# Patient Record
Sex: Male | Born: 1945 | Race: Black or African American | Hispanic: No | Marital: Single | Smoking: Former smoker
Health system: Southern US, Community
[De-identification: ages and names within clinical notes are randomized; demographics above are authoritative.]

## PROBLEM LIST (undated history)

## (undated) DIAGNOSIS — I10 Essential (primary) hypertension: Secondary | ICD-10-CM

## (undated) DIAGNOSIS — E559 Vitamin D deficiency, unspecified: Secondary | ICD-10-CM

## (undated) DIAGNOSIS — E785 Hyperlipidemia, unspecified: Secondary | ICD-10-CM

## (undated) HISTORY — PX: OTHER SURGICAL HISTORY: SHX169

## (undated) HISTORY — DX: Essential (primary) hypertension: I10

## (undated) HISTORY — DX: Hyperlipidemia, unspecified: E78.5

## (undated) HISTORY — DX: Vitamin D deficiency, unspecified: E55.9

---

## 2014-08-21 ENCOUNTER — Encounter: Payer: Self-pay | Attending: "Endocrinology | Admitting: Nutrition

## 2014-08-21 VITALS — Ht 70.0 in | Wt 180.0 lb

## 2014-08-21 DIAGNOSIS — E119 Type 2 diabetes mellitus without complications: Secondary | ICD-10-CM

## 2014-08-21 NOTE — Patient Instructions (Signed)
Plan:  Aim for 3-4 Carb Choices per meal (45-60 grams) +/- 1 either way  Aim for 0-1 Carbs per snack if hungry  Include protein in moderation with your meals and snacks Consider reading food labels for Total Carbohydrate and Fat Grams of foods Consider  increasing your activity level 15-30 minutes daily as tolerated Consider checking BG at alternate times per day  Continue taking medication as directed by MD

## 2014-08-21 NOTE — Progress Notes (Signed)
  Medical Nutrition Therapy:  Appt start time: 1000 end time:  1100.   Assessment:  Primary concerns today: Diabetes Type 2. A1C has improved down to 10.1 now from 16.5% 1 year ago. He is complaining of having balancing issues. Has met with a dietitian before but has been a long time ago. Tests his blood sugars in the am. BS usually 150-180's. Hasn't worked in 6-8 months. He is motivated to improve his blood sugars. He has poor carious teeth and can't eat tough foods. Admits to eating a lot of bananas recently. Usually eats 2 meals per day. Starting to walk for exercise.  Preferred Learning Style:  Auditory  Visual  Learning Readiness:   Ready  MEDICATIONS: See list below   DIETARY INTAKE:  24-hr recall:  B ( AM): 2 pkts of flavored oatmea, 8 oz juice  Snk ( AM): banana L ( PM): sandwich, water Snk ( PM):none D ( PM): meat, vegetables, water  Snk ( PM): banana-sometimes eats 3-4 a day Beverages: water, juice  Usual physical activity: starting to walk. Hasn't worked in 6-8 months  Estimated energy needs: 2000 calories  225 g carbohydrates 125 g protein 68 g fat  Progress Towards Goal(s):  In progress.   Nutritional Diagnosis:  NB-1.1 Food and nutrition-related knowledge deficit As related to uncontrolled diabetes.  As evidenced by A1C of 101%.    Intervention:  Nutrition and diabetes education provided using My Plate and Carb Counting Meal Card.  Plan:  Aim for 3-4 Carb Choices per meal (45-60 grams) +/- 1 either way  Aim for 0-1 Carbs per snack if hungry  Include protein in moderation with your meals and snacks Consider reading food labels for Total Carbohydrate and Fat Grams of foods Consider  increasing your activity level 15-30 minutes daily as tolerated Consider checking BG at alternate times per day  Continue taking medication as directed by MD   1. Eat three meals per day.  2  Test blood sugar 2 hours after breakfast once to see if BS is greater 180 mg/dl.  If greater than 180 mg/dl, then cut back to 1 packet of oatmeal and 1/2 banana.  3. Avoid drinking juice unless having a low blood sugar.  4. Walk 30 minutes dialy.  Handouts given during class include:  Meal Plan Card  Carbohydrate guide  Follow-Up Plan: Plan:  Aim for 3-4 Carb Choices per meal (45-60 grams) +/- 1 either way  Aim for 0-1 Carbs per snack if hungry  Include protein in moderation with your meals and snacks Consider reading food labels for Total Carbohydrate and Fat Grams of foods Consider activity for 30 minutes daily as tolerated Consider checking BG at alternate times per day   Teaching Method Utilized: Visual Auditory Hands on  Handouts given during visit include:  Meal Card  The Plate Method  Current HbA1c: 10.1  Barriers to learning/adherence to lifestyle change: none  Demonstrated degree of understanding via:  Teach Back   Monitoring/Evaluation:  Dietary intake, exercise, carb counting, The Plate Method, and body weight in 1 month(s).

## 2015-09-02 ENCOUNTER — Other Ambulatory Visit: Payer: Self-pay | Admitting: "Endocrinology

## 2015-09-03 ENCOUNTER — Other Ambulatory Visit: Payer: Self-pay | Admitting: "Endocrinology

## 2015-09-09 ENCOUNTER — Other Ambulatory Visit: Payer: Self-pay | Admitting: "Endocrinology

## 2015-09-11 LAB — HEMOGLOBIN A1C: HEMOGLOBIN A1C: 13.4 % — AB (ref 4.0–6.0)

## 2015-09-14 ENCOUNTER — Ambulatory Visit: Payer: Self-pay | Admitting: "Endocrinology

## 2015-10-09 ENCOUNTER — Encounter: Payer: Self-pay | Admitting: "Endocrinology

## 2015-10-09 ENCOUNTER — Ambulatory Visit (INDEPENDENT_AMBULATORY_CARE_PROVIDER_SITE_OTHER): Payer: Medicare Other | Admitting: "Endocrinology

## 2015-10-09 ENCOUNTER — Ambulatory Visit: Payer: Self-pay | Admitting: "Endocrinology

## 2015-10-09 VITALS — BP 144/80 | HR 86 | Ht 69.0 in | Wt 176.0 lb

## 2015-10-09 DIAGNOSIS — Z794 Long term (current) use of insulin: Secondary | ICD-10-CM

## 2015-10-09 DIAGNOSIS — E118 Type 2 diabetes mellitus with unspecified complications: Secondary | ICD-10-CM

## 2015-10-09 DIAGNOSIS — E785 Hyperlipidemia, unspecified: Secondary | ICD-10-CM

## 2015-10-09 DIAGNOSIS — I10 Essential (primary) hypertension: Secondary | ICD-10-CM | POA: Diagnosis not present

## 2015-10-09 DIAGNOSIS — Z91199 Patient's noncompliance with other medical treatment and regimen due to unspecified reason: Secondary | ICD-10-CM

## 2015-10-09 DIAGNOSIS — Z9119 Patient's noncompliance with other medical treatment and regimen: Secondary | ICD-10-CM | POA: Diagnosis not present

## 2015-10-09 DIAGNOSIS — E1165 Type 2 diabetes mellitus with hyperglycemia: Secondary | ICD-10-CM | POA: Insufficient documentation

## 2015-10-09 DIAGNOSIS — IMO0002 Reserved for concepts with insufficient information to code with codable children: Secondary | ICD-10-CM | POA: Insufficient documentation

## 2015-10-09 DIAGNOSIS — E559 Vitamin D deficiency, unspecified: Secondary | ICD-10-CM

## 2015-10-09 NOTE — Progress Notes (Signed)
Subjective:    Patient ID: Charles Glass, male    DOB: 1946/05/24,    Past Medical History  Diagnosis Date  . Hypertension   . Hyperlipidemia   . Vitamin D deficiency    Past Surgical History  Procedure Laterality Date  . C-spine    . Other surgical history  Left Arm   Social History   Social History  . Marital Status: Single    Spouse Name: N/A  . Number of Children: N/A  . Years of Education: N/A   Social History Main Topics  . Smoking status: Former Games developer  . Smokeless tobacco: None  . Alcohol Use: No  . Drug Use: No  . Sexual Activity: Not Asked   Other Topics Concern  . None   Social History Narrative  . None   Outpatient Encounter Prescriptions as of 10/09/2015  Medication Sig  . amitriptyline (ELAVIL) 25 MG tablet Take 25 mg by mouth at bedtime.  Marland Kitchen aspirin 325 MG tablet Take 325 mg by mouth daily.  . insulin detemir (LEVEMIR) 100 UNIT/ML injection Inject 40 Units into the skin at bedtime.  . metFORMIN (GLUCOPHAGE) 1000 MG tablet Take 1,000 mg by mouth 2 (two) times daily with a meal.  . multivitamin-iron-minerals-folic acid (CENTRUM) chewable tablet Chew 1 tablet by mouth daily.  . pravastatin (PRAVACHOL) 40 MG tablet Take 40 mg by mouth daily.  . [DISCONTINUED] dapagliflozin propanediol (FARXIGA) 10 MG TABS tablet Take 10 mg by mouth daily.   No facility-administered encounter medications on file as of 10/09/2015.   ALLERGIES: No Known Allergies VACCINATION STATUS:  There is no immunization history on file for this patient.  Diabetes He presents for his follow-up diabetic visit. He has type 2 diabetes mellitus. Onset time: He was diagnosed at approximate age of 46 years. His disease course has been worsening. There are no hypoglycemic associated symptoms. Pertinent negatives for hypoglycemia include no confusion, headaches, pallor or seizures. Associated symptoms include polydipsia and polyuria. Pertinent negatives for diabetes include no chest pain, no  fatigue, no polyphagia and no weakness. There are no hypoglycemic complications. Symptoms are worsening. There are no diabetic complications. (He has dental problems. He starts complaining of blurry vision.) Risk factors for coronary artery disease include diabetes mellitus, dyslipidemia, male sex, sedentary lifestyle and tobacco exposure. He is compliant with treatment some of the time. His weight is stable. He is following a generally unhealthy diet. He has not had a previous visit with a dietitian. He participates in exercise intermittently. Home blood sugar record trend: He came with no meter nor logs to review.  Hyperlipidemia This is a chronic problem. The current episode started more than 1 year ago. Pertinent negatives include no chest pain, myalgias or shortness of breath. Risk factors for coronary artery disease include diabetes mellitus, dyslipidemia, hypertension, male sex and a sedentary lifestyle.  Hypertension This is a chronic problem. The current episode started more than 1 year ago. Pertinent negatives include no chest pain, headaches, neck pain, palpitations or shortness of breath. Risk factors for coronary artery disease include diabetes mellitus, dyslipidemia and sedentary lifestyle.     Review of Systems  Constitutional: Negative for fatigue and unexpected weight change.  HENT: Negative for dental problem, mouth sores and trouble swallowing.   Eyes: Negative for visual disturbance.  Respiratory: Negative for cough, choking, chest tightness, shortness of breath and wheezing.   Cardiovascular: Negative for chest pain, palpitations and leg swelling.  Gastrointestinal: Negative for nausea, vomiting, abdominal pain, diarrhea, constipation  and abdominal distention.  Endocrine: Positive for polydipsia and polyuria. Negative for polyphagia.  Genitourinary: Negative for dysuria, urgency, hematuria and flank pain.  Musculoskeletal: Negative for myalgias, back pain, gait problem and neck  pain.  Skin: Negative for pallor, rash and wound.  Neurological: Negative for seizures, syncope, weakness, numbness and headaches.  Psychiatric/Behavioral: Negative.  Negative for confusion and dysphoric mood.    Objective:    BP 144/80 mmHg  Pulse 86  Ht 5\' 9"  (1.753 m)  Wt 176 lb (79.833 kg)  BMI 25.98 kg/m2  SpO2 96%  Wt Readings from Last 3 Encounters:  10/09/15 176 lb (79.833 kg)  08/21/14 180 lb (81.647 kg)    Physical Exam  Constitutional: He is oriented to person, place, and time. He appears well-developed and well-nourished. He is cooperative. No distress.  HENT:  Head: Normocephalic and atraumatic.  Eyes: EOM are normal.  Neck: Normal range of motion. Neck supple. No tracheal deviation present. No thyromegaly present.  Cardiovascular: Normal rate, S1 normal, S2 normal and normal heart sounds.  Exam reveals no gallop.   No murmur heard. Pulses:      Dorsalis pedis pulses are 1+ on the right side, and 1+ on the left side.       Posterior tibial pulses are 1+ on the right side, and 1+ on the left side.  Pulmonary/Chest: Breath sounds normal. No respiratory distress. He has no wheezes.  Abdominal: Soft. Bowel sounds are normal. He exhibits no distension. There is no tenderness. There is no guarding and no CVA tenderness.  Musculoskeletal: He exhibits no edema.       Right shoulder: He exhibits no swelling and no deformity.  Neurological: He is alert and oriented to person, place, and time. He has normal strength and normal reflexes. No cranial nerve deficit or sensory deficit. Gait normal.  Skin: Skin is warm and dry. No rash noted. No cyanosis. Nails show no clubbing.  Psychiatric: He has a normal mood and affect. His speech is normal and behavior is normal. Judgment and thought content normal. Cognition and memory are normal.          Assessment & Plan:   1. Uncontrolled type 2 diabetes mellitus with complication, with long-term current use of insulin Charles Glass(HCC)  -Mr.  Charles Kettleeal remains seriously noncompliant. He  remains at a high risk for more acute and chronic complications of diabetes which include CAD, CVA, CKD, retinopathy, and neuropathy. These are all discussed in detail with the patient.  Patient came with out any glucose profile, and  recent A1c of 13.4 %.  Recent labs reviewed.   - I have re-counseled the patient on diet management   by adopting a carbohydrate restricted / protein rich  Diet.  - Suggestion is made for patient to avoid simple carbohydrates   from their diet including Cakes , Desserts, Ice Cream,  Soda (  diet and regular) , Sweet Tea , Candies,  Chips, Cookies, Artificial Sweeteners,   and "Sugar-free" Products .  This will help patient to have stable blood glucose profile and potentially avoid unintended  Weight gain.  - Patient is advised to stick to a routine mealtimes to eat 3 meals  a day and avoid unnecessary snacks ( to snack only to correct hypoglycemia).  - The patient  will be  scheduled with Norm SaltPenny Crumpton, RDN, CDE for individualized DM education.  - I have approached patient with the following individualized plan to manage diabetes and patient agrees.  - I will increase  his basal insulin Levemir to 40 units QHS, associated with strict monitoring of glucose  AC and HS. -He will need bolus insulin if he commits properly for monitoring blood glucose. -If he brings his logs I would consider for prandial insulin in 2 weeks time. - Patient is warned not to take insulin without proper monitoring per orders.  -Patient is encouraged to call clinic for blood glucose levels less than 70 or above 300 mg /dl. - I will continue metformin 1000 mg by mouth twice a day, therapeutically suitable for patient. -He did not afford [place].   - Patient specific target  for A1c; LDL, HDL, Triglycerides, and  Waist Circumference were discussed in detail.  2) BP/HTN: Controlled. Continue current medications . 3) Lipids/HPL:  continue  statins. 4)  Weight/Diet: CDE consult in progress, exercise, and carbohydrates information provided.  5) Vitamin D deficiency -New diagnosis. I advised him to continue vitamin D supplement 50,000 units weekly for the next 12 weeks.  6) Personal history of noncompliance with medical treatment, presenting hazards to health -Patient has a habit of not showing up for appointments and loses interest very easily in self-care. I have spent 20 minutes counseling him to come eat better to treat diabetes to avoid complications.  7) Chronic Care/Health Maintenance:  -Patient  Is on  Statin medications and encouraged to continue to follow up with Ophthalmology, Podiatrist at least yearly or according to recommendations, and advised to  stay away from smoking. I have recommended yearly flu vaccine and pneumonia vaccination at least every 5 years; moderate intensity exercise for up to 150 minutes weekly; and  sleep for at least 7 hours a day.  I advised patient to maintain close follow up with their PCP for primary care needs.  Patient is asked to bring meter and  blood glucose logs during their next visit.   Follow up plan: Return in about 2 weeks (around 10/23/2015) for diabetes, high blood pressure, high cholesterol.  Marquis Lunch, MD Phone: [phone]  Fax: [phone]   10/09/2015, 11:51 AM

## 2015-10-09 NOTE — Patient Instructions (Signed)

## 2015-10-23 ENCOUNTER — Telehealth: Payer: Self-pay | Admitting: *Deleted

## 2015-10-23 ENCOUNTER — Encounter: Payer: Medicare Other | Admitting: "Endocrinology

## 2015-10-23 ENCOUNTER — Ambulatory Visit: Payer: Medicare Other | Admitting: "Endocrinology

## 2015-10-23 ENCOUNTER — Encounter: Payer: Self-pay | Admitting: "Endocrinology

## 2015-10-23 NOTE — Telephone Encounter (Signed)
We will wait to see his meter and logs before we refill and he has to keep his appointment. He may get a sample of insulin he is taking-1 each.

## 2015-10-23 NOTE — Telephone Encounter (Signed)
Patient came in for his 2 week follow up appointment, however, he forgot his logs and meter, therefore he was asked to reschedule his appointment. He is requesting a  refill on his Levemir (pen) [facility].

## 2015-10-26 NOTE — Progress Notes (Signed)
This patient no showed on 10/23/2015. He was not seen.

## 2015-10-27 NOTE — Telephone Encounter (Signed)
Routing to Mohawk IndustriesKim French, LPN

## 2015-11-02 ENCOUNTER — Other Ambulatory Visit: Payer: Self-pay | Admitting: "Endocrinology

## 2015-11-03 NOTE — Telephone Encounter (Signed)
Called pt and left messages. No reply.

## 2015-11-04 ENCOUNTER — Other Ambulatory Visit: Payer: Self-pay | Admitting: "Endocrinology

## 2015-11-04 MED ORDER — INSULIN PEN NEEDLE 31G X 8 MM MISC: 1.0000 | Status: DC

## 2015-11-05 NOTE — Progress Notes (Signed)
This encounter was created in error - please disregard.

## 2015-11-11 ENCOUNTER — Ambulatory Visit: Payer: Medicare Other | Admitting: "Endocrinology

## 2015-11-16 ENCOUNTER — Ambulatory Visit: Payer: Self-pay | Admitting: Nutrition

## 2015-11-16 NOTE — Progress Notes (Signed)
Chart opened in error. PC

## 2015-11-26 ENCOUNTER — Ambulatory Visit: Payer: Medicare Other | Admitting: "Endocrinology

## 2015-12-01 ENCOUNTER — Emergency Department (HOSPITAL_COMMUNITY): Payer: Medicare Other

## 2015-12-01 ENCOUNTER — Emergency Department (HOSPITAL_COMMUNITY)
Admission: EM | Admit: 2015-12-01 | Discharge: 2015-12-01 | Disposition: A | Discharge status: Home / Self Care | Payer: Medicare Other | Attending: Emergency Medicine | Admitting: Emergency Medicine

## 2015-12-01 ENCOUNTER — Encounter (HOSPITAL_COMMUNITY): Payer: Self-pay | Admitting: *Deleted

## 2015-12-01 DIAGNOSIS — S4992XA Unspecified injury of left shoulder and upper arm, initial encounter: Secondary | ICD-10-CM | POA: Insufficient documentation

## 2015-12-01 DIAGNOSIS — Z7984 Long term (current) use of oral hypoglycemic drugs: Secondary | ICD-10-CM | POA: Insufficient documentation

## 2015-12-01 DIAGNOSIS — I10 Essential (primary) hypertension: Secondary | ICD-10-CM | POA: Insufficient documentation

## 2015-12-01 DIAGNOSIS — Y998 Other external cause status: Secondary | ICD-10-CM | POA: Diagnosis not present

## 2015-12-01 DIAGNOSIS — E785 Hyperlipidemia, unspecified: Secondary | ICD-10-CM | POA: Insufficient documentation

## 2015-12-01 DIAGNOSIS — S20219A Contusion of unspecified front wall of thorax, initial encounter: Secondary | ICD-10-CM | POA: Diagnosis not present

## 2015-12-01 DIAGNOSIS — Z79899 Other long term (current) drug therapy: Secondary | ICD-10-CM | POA: Insufficient documentation

## 2015-12-01 DIAGNOSIS — S199XXA Unspecified injury of neck, initial encounter: Secondary | ICD-10-CM | POA: Insufficient documentation

## 2015-12-01 DIAGNOSIS — S20212A Contusion of left front wall of thorax, initial encounter: Secondary | ICD-10-CM

## 2015-12-01 DIAGNOSIS — Z7982 Long term (current) use of aspirin: Secondary | ICD-10-CM | POA: Diagnosis not present

## 2015-12-01 DIAGNOSIS — Y92194 Driveway of other specified residential institution as the place of occurrence of the external cause: Secondary | ICD-10-CM | POA: Diagnosis not present

## 2015-12-01 DIAGNOSIS — Z792 Long term (current) use of antibiotics: Secondary | ICD-10-CM | POA: Insufficient documentation

## 2015-12-01 DIAGNOSIS — S43102A Unspecified dislocation of left acromioclavicular joint, initial encounter: Secondary | ICD-10-CM

## 2015-12-01 DIAGNOSIS — S299XXA Unspecified injury of thorax, initial encounter: Secondary | ICD-10-CM | POA: Diagnosis present

## 2015-12-01 DIAGNOSIS — Y9389 Activity, other specified: Secondary | ICD-10-CM | POA: Insufficient documentation

## 2015-12-01 MED ORDER — HYDROCODONE-ACETAMINOPHEN 5-325 MG PO TABS: 1.0000 | ORAL_TABLET | Freq: Four times a day (QID) | ORAL | Status: DC | PRN

## 2015-12-01 MED ORDER — DOCUSATE SODIUM 100 MG PO CAPS: 100.0000 mg | ORAL_CAPSULE | Freq: Two times a day (BID) | ORAL | Status: DC

## 2015-12-01 MED ORDER — ACETAMINOPHEN 500 MG PO TABS
1000.0000 mg | ORAL_TABLET | Freq: Once | ORAL | Status: AC
  Administered 2015-12-01: 1000 mg via ORAL
  Filled 2015-12-01: qty 2
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
## 2015-12-01 NOTE — Discharge Instructions (Signed)
Acromioclavicular Separation With Rehab The acromioclavicular joint is the joint between the roof of the shoulder (acromion) and the collarbone (clavicle). It is vulnerable to injury. An acromioclavicular [facility]) separation is a partial or complete tear (sprain), injury, or redness and soreness (inflammation) of the ligaments that cross the acromioclavicular joint and hold it in place. There are two ligaments in this area that are vulnerable to injury, the acromioclavicular ligament and the coracoclavicular ligament. SYMPTOMS   Tenderness and swelling, or a bump on top of the shoulder (at the [facility] joint).  Bruising (contusion) in the area within 48 hours of injury.  Loss of strength or pain when reaching over the head or across the body. CAUSES  AC separation is caused by direct trauma to the joint (falling on your shoulder) or indirect trauma (falling on an outstretched arm). RISK INCREASES WITH:  Sports that require contact or collision, throwing sports (i.e. racquetball, squash).  Poor strength and flexibility.  Previous shoulder sprain or dislocation.  Poorly fitted or padded protective equipment. PREVENTION   Warm-up and stretch properly before activity.  Maintain physical fitness:  Shoulder strength.  Shoulder flexibility.  Cardiovascular fitness.  Wear properly fitted and padded protective equipment.  Learn and use proper technique when playing sports. Have a coach correct improper technique, including falling and landing.  Apply taping, protective strapping or padding, or an adhesive bandage as recommended before practice or competition. PROGNOSIS   If treated properly, the symptoms of AC separation can be expected to go away.  If treated improperly, permanent disability may occur unless surgery is performed.  Healing time varies with type of sport and position, arm injured (dominant versus non-dominant) and severity of sprain. RELATED COMPLICATIONS  Weakness and  fatigue of the arm or shoulder are possible but uncommon.  Pain and inflammation of the [facility] joint may continue.  Prolonged healing time may be necessary if usual activities are resumed too early. This causes a susceptibility to recurrent injury.  Prolonged disability may occur.  The shoulder may remain unstable or arthritic following repeated injury. TREATMENT  Treatment initially involves ice and medication to help reduce pain and inflammation. It may also be necessary to modify your activities in order to prevent further injury. Both non-surgical and surgical interventions exist to treat AC separation. Non-surgical intervention is usually recommended and involves wearing a sling to immobilize the joint for a period of time to allow for healing. Surgical intervention is usually only considered for severe sprains of the ligament or for individuals who do not improve after 2 to 6 months of non-surgical treatment. Surgical interventions require 4 to 6 months before a return to sports is possible. MEDICATION  If pain medication is necessary, nonsteroidal anti-inflammatory medications, such as aspirin and ibuprofen, or other minor pain relievers, such as acetaminophen, are often recommended.  Do not take pain medication for 7 days before surgery.  Prescription pain relievers may be given by your caregiver. Use only as directed and only as much as you need.  Ointments applied to the skin may be helpful.  Corticosteroid injections may be given to reduce inflammation. HEAT AND COLD  Cold treatment (icing) relieves pain and reduces inflammation. Cold treatment should be applied for 10 to 15 minutes every 2 to 3 hours for inflammation and pain and immediately after any activity that aggravates your symptoms. Use ice packs or an ice massage.  Heat treatment may be used prior to performing the stretching and strengthening activities prescribed by your caregiver, physical therapist or  Pensions consultantathletic  trainer. Use a heat pack or a warm soak. SEEK IMMEDIATE MEDICAL CARE IF:   Pain, swelling or bruising worsens despite treatment.  There is pain, numbness or coldness in the arm.  Discoloration appears in the fingernails.  New, unexplained symptoms develop.    Chest Contusion A chest contusion is a deep bruise on your chest area. Contusions are the result of an injury that caused bleeding under the skin. A chest contusion may involve bruising of the skin, muscles, or ribs. The contusion may turn blue, purple, or yellow. Minor injuries will give you a painless contusion, but more severe contusions may stay painful and swollen for a few weeks. CAUSES  A contusion is usually caused by a blow, trauma, or direct force to an area of the body. SYMPTOMS   Swelling and redness of the injured area.  Discoloration of the injured area.  Tenderness and soreness of the injured area.  Pain. DIAGNOSIS  The diagnosis can be made by taking a history and performing a physical exam. An X-ray, CT scan, or MRI may be needed to determine if there were any associated injuries, such as broken bones (fractures) or internal injuries. TREATMENT  Often, the best treatment for a chest contusion is resting, icing, and applying cold compresses to the injured area. Deep breathing exercises may be recommended to reduce the risk of pneumonia. Over-the-counter medicines may also be recommended for pain control. HOME CARE INSTRUCTIONS   Put ice on the injured area.  Put ice in a plastic bag.  Place a towel between your skin and the bag.  Leave the ice on for 15-20 minutes, 03-04 times a day.  Only take over-the-counter or prescription medicines as directed by your caregiver. Your caregiver may recommend avoiding anti-inflammatory medicines (aspirin, ibuprofen, and naproxen) for 48 hours because these medicines may increase bruising.  Rest the injured area.  Perform deep-breathing exercises as directed by your  caregiver.  Stop smoking if you smoke.  Do not lift objects over 5 pounds (2.3 kg) for 3 days or longer if recommended by your caregiver. SEEK IMMEDIATE MEDICAL CARE IF:   You have increased bruising or swelling.  You have pain that is getting worse.  You have difficulty breathing.  You have dizziness, weakness, or fainting.  You have blood in your urine or stool.  You cough up or vomit blood.  Your swelling or pain is not relieved with medicines. MAKE SURE YOU:   Understand these instructions.  Will watch your condition.  Will get help right away if you are not doing well or get worse.   This information is not intended to replace advice given to you by your health care provider. Make sure you discuss any questions you have with your health care provider.   Document Released: 08/16/2001 Document Revised: 08/15/2012 Document Reviewed: 05/14/2012 Elsevier Interactive Patient Education [employer]2016 Elsevier Inc.

## 2015-12-01 NOTE — ED Notes (Signed)
Pt made aware to return if symptoms worsen or if any life threatening symptoms occur.   

## 2015-12-21 ENCOUNTER — Ambulatory Visit: Payer: Medicare Other | Admitting: "Endocrinology

## 2015-12-25 ENCOUNTER — Telehealth: Payer: Self-pay | Admitting: Nutrition

## 2016-01-05 ENCOUNTER — Ambulatory Visit: Payer: Medicare Other | Admitting: "Endocrinology

## 2016-01-08 NOTE — Telephone Encounter (Signed)
VM to call for appt. 

## 2016-01-11 ENCOUNTER — Encounter: Payer: Self-pay | Admitting: "Endocrinology

## 2016-01-24 ENCOUNTER — Other Ambulatory Visit: Payer: Self-pay | Admitting: "Endocrinology

## 2016-01-25 ENCOUNTER — Ambulatory Visit: Payer: Medicare Other | Admitting: Endocrinology

## 2016-02-01 ENCOUNTER — Ambulatory Visit: Payer: Medicare Other | Admitting: Endocrinology

## 2016-02-01 ENCOUNTER — Ambulatory Visit: Payer: Medicare Other | Admitting: Nutrition

## 2016-02-17 ENCOUNTER — Ambulatory Visit (INDEPENDENT_AMBULATORY_CARE_PROVIDER_SITE_OTHER): Payer: Medicare Other | Admitting: Endocrinology

## 2016-02-17 ENCOUNTER — Encounter: Payer: Self-pay | Admitting: Endocrinology

## 2016-02-17 VITALS — BP 132/80 | HR 86 | Temp 97.5°F | Ht 70.0 in | Wt 176.6 lb

## 2016-02-17 DIAGNOSIS — Z794 Long term (current) use of insulin: Secondary | ICD-10-CM

## 2016-02-17 DIAGNOSIS — E1165 Type 2 diabetes mellitus with hyperglycemia: Secondary | ICD-10-CM

## 2016-02-17 DIAGNOSIS — E118 Type 2 diabetes mellitus with unspecified complications: Secondary | ICD-10-CM | POA: Diagnosis not present

## 2016-02-17 DIAGNOSIS — IMO0002 Reserved for concepts with insufficient information to code with codable children: Secondary | ICD-10-CM

## 2016-02-17 LAB — POCT GLYCOSYLATED HEMOGLOBIN (HGB A1C): Hemoglobin A1C: 13.7

## 2016-02-17 MED ORDER — INSULIN DETEMIR 100 UNIT/ML FLEXPEN: 50.0000 [IU] | PEN_INJECTOR | SUBCUTANEOUS | Status: DC

## 2016-02-17 NOTE — Progress Notes (Signed)
Pre visit review using our clinic review tool, if applicable. No additional management support is needed unless otherwise documented below in the visit note. 

## 2016-02-17 NOTE — Patient Instructions (Addendum)
good diet and exercise significantly improve the control of your diabetes.  please let me know if you wish to be referred to a dietician.  high blood sugar is very risky to your health.  you should see an eye doctor and dentist every year.  It is very important to get all recommended vaccinations.  controlling your blood pressure and cholesterol drastically reduces the damage diabetes does to your body.  Those who smoke should quit.  please discuss these with your doctor.   check your blood sugar twice a day.  vary the time of day when you check, between before the 3 meals, and at bedtime.  also check if you have symptoms of your blood sugar being too high or too low.  please keep a record of the readings and bring it to your next appointment here (or you can bring the meter itself).  You can write it on any piece of paper.  please call us sooner if your blood sugar goes below 70, or if you have a lot of readings over 200.  Please stop taking the metformin, and:  Increase the levemir to 50 units daily, and take it in the morning.  I have sent a prescription to your pharmacy.   Please call us next week, to tell us how the blood sugar is doing.  Please come back for a follow-up appointment in 1 month.

## 2016-02-17 NOTE — Progress Notes (Signed)
Subjective:    Patient ID: Charles Glass, male    DOB: 07/29/1946, 70 y.o.   MRN: [id number]  HPI pt states DM was dx'ed in 2000; he has mild neuropathy of the lower extremities, but he is unaware of any associated chronic complications; he has been on insulin since 2015; pt says his diet and exercise are not good; he has never had pancreatitis, severe hypoglycemia or DKA.  He says he now never misses the insulin.  He takes levemir and metformin.  He denies hypoglycemia Past Medical History  Diagnosis Date  . Hypertension   . Hyperlipidemia   . Vitamin D deficiency     Past Surgical History  Procedure Laterality Date  . C-spine    . Other surgical history  Left Arm    Social History   Social History  . Marital Status: Single    Spouse Name: N/A  . Number of Children: N/A  . Years of Education: N/A   Occupational History  . Not on file.   Social History Main Topics  . Smoking status: Former Games developermoker  . Smokeless tobacco: Not on file  . Alcohol Use: No  . Drug Use: No  . Sexual Activity: Not on file   Other Topics Concern  . Not on file   Social History Narrative    Current Outpatient Prescriptions on File Prior to Visit  Medication Sig Dispense Refill  . amitriptyline (ELAVIL) 25 MG tablet Take 25 mg by mouth at bedtime.    Marland Kitchen. aspirin 325 MG tablet Take 325 mg by mouth daily.    . multivitamin-iron-minerals-folic acid (CENTRUM) chewable tablet Chew 1 tablet by mouth daily.    . pravastatin (PRAVACHOL) 40 MG tablet Take 40 mg by mouth daily.    Marland Kitchen. UNIFINE PENTIPS 31G X 6 MM MISC USE ONE PENTIP ONCE DAILY AS  DIRECTED 30 each 0   No current facility-administered medications on file prior to visit.    No Known Allergies  Family History  Problem Relation Age of Onset  . Diabetes Mother     BP 132/80 mmHg  Pulse 86  Temp(Src) 97.5 F (36.4 C) (Oral)  Ht 5\' 10"  (1.778 m)  Wt 176 lb 9.6 oz (80.105 kg)  BMI 25.34 kg/m2  SpO2 98%    Review of Systems denies  weight loss, blurry vision, headache, chest pain, sob, n/v, urinary frequency, muscle cramps, excessive diaphoresis, depression, cold intolerance, rhinorrhea, and easy bruising.     Objective:   Physical Exam VS: see vs page GEN: no distress HEAD: head: no deformity eyes: no periorbital swelling, no proptosis external nose and ears are normal mouth: no lesion seen NECK: supple, thyroid is not enlarged.   CHEST WALL: no deformity LUNGS: clear to auscultation BREASTS:  No gynecomastia CV: reg rate and rhythm, no murmur ABD: abdomen is soft, nontender.  no hepatosplenomegaly.  not distended.  no hernia MUSCULOSKELETAL: muscle bulk and strength are grossly normal.  no obvious joint swelling.  gait is normal and steady EXTEMITIES: no deformity.  no ulcer on the feet.  feet are of normal color and temp.  no edema PULSES: dorsalis pedis intact bilat.  no carotid bruit NEURO:  cn 2-12 grossly intact.   readily moves all 4's.  sensation is intact to touch on the feet SKIN:  Normal texture and temperature.  No rash or suspicious lesion is visible.   NODES:  None palpable at the neck.   PSYCH: alert, well-oriented.  Does not appear anxious nor  depressed.    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  Hypertriglyceridemia: prob exac by hyperglycemia.  We'll recheck when glycemic control is better.    Patient is advised the following: Patient Instructions  good diet and exercise significantly improve the control of your diabetes.  please let me know if you wish to be referred to a dietician.  high blood sugar is very risky to your health.  you should see an eye doctor and dentist every year.  It is very important to get all recommended vaccinations.  controlling your blood pressure and  cholesterol drastically reduces the damage diabetes does to your body.  Those who smoke should quit.  please discuss these with your doctor.   check your blood sugar twice a day.  vary the time of day when you check, between before the 3 meals, and at bedtime.  also check if you have symptoms of your blood sugar being too high or too low.  please keep a record of the readings and bring it to your next appointment here (or you can bring the meter itself).  You can write it on any piece of paper.  please call us sooner if your blood sugar goes below 70, or if you have a lot of readings over 200.  Please stop taking the metformin, and:  Increase the levemir to 50 units daily, and take it in the morning.  I have sent a prescription to your pharmacy.   Please call us next week, to tell us how the blood sugar is doing.  Please come back for a follow-up appointment in 1 month.

## 2016-03-03 ENCOUNTER — Telehealth: Payer: Self-pay | Admitting: Endocrinology

## 2016-03-03 MED ORDER — INSULIN DETEMIR 100 UNIT/ML FLEXPEN: 50.0000 [IU] | PEN_INJECTOR | SUBCUTANEOUS | Status: DC

## 2016-03-03 MED ORDER — INSULIN PEN NEEDLE 31G X 6 MM MISC: Status: AC

## 2016-03-03 NOTE — Telephone Encounter (Signed)
Levemir: please refill prn Pravastatin: please ask pt to call pcp for refill.

## 2016-03-03 NOTE — Telephone Encounter (Signed)
PT also needs Pravastatin sent into the pharmacy

## 2016-03-03 NOTE — Telephone Encounter (Signed)
See note below and please advise if we can refill the pravastatin. Thanks!

## 2016-03-03 NOTE — Telephone Encounter (Signed)
Left  vm advising pt we have refill the Levemir, but the pravastatin rx would need to come from the pt's PCP. Requested a call back if the pt would like to discuss.

## 2016-03-03 NOTE — Telephone Encounter (Signed)
Pt needs refills of Levemir and [facility]

## 2016-03-23 ENCOUNTER — Ambulatory Visit (INDEPENDENT_AMBULATORY_CARE_PROVIDER_SITE_OTHER): Payer: Medicare Other | Admitting: Endocrinology

## 2016-03-23 ENCOUNTER — Encounter: Payer: Self-pay | Admitting: Endocrinology

## 2016-03-23 VITALS — BP 124/80 | HR 91 | Ht 70.0 in | Wt 171.0 lb

## 2016-03-23 DIAGNOSIS — E118 Type 2 diabetes mellitus with unspecified complications: Secondary | ICD-10-CM | POA: Diagnosis not present

## 2016-03-23 DIAGNOSIS — E1165 Type 2 diabetes mellitus with hyperglycemia: Secondary | ICD-10-CM | POA: Diagnosis not present

## 2016-03-23 DIAGNOSIS — Z794 Long term (current) use of insulin: Secondary | ICD-10-CM

## 2016-03-23 DIAGNOSIS — IMO0002 Reserved for concepts with insufficient information to code with codable children: Secondary | ICD-10-CM

## 2016-03-23 MED ORDER — INSULIN DETEMIR 100 UNIT/ML FLEXPEN: 60.0000 [IU] | PEN_INJECTOR | SUBCUTANEOUS | Status: DC

## 2016-03-23 NOTE — Patient Instructions (Addendum)
check your blood sugar twice a day.  vary the time of day when you check, between before the 3 meals, and at bedtime.  also check if you have symptoms of your blood sugar being too high or too low.  please keep a record of the readings and bring it to your next appointment here (or you can bring the meter itself).  You can write it on any piece of paper.  please call us sooner if your blood sugar goes below 70, or if you have a lot of readings over 200.  Increase the levemir to 60 units each morning.   Please come back for a follow-up appointment in 1 month.

## 2016-03-23 NOTE — Progress Notes (Signed)
Subjective:    Patient ID: Charles Glass, male    DOB: 10/25/1946, 70 y.o.   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  Interval history: Meter is downloaded today, and the printout is scanned into the record.  cbg varies from 150-280.  All are checked in am.  Pt says he sometimes misses the insulin injections.   Past Medical History  Diagnosis Date  . Hypertension   . Hyperlipidemia   . Vitamin D deficiency     Past Surgical History  Procedure Laterality Date  . C-spine    . Other surgical history  Left Arm    Social History   Social History  . Marital Status: Single    Spouse Name: N/A  . Number of Children: N/A  . Years of Education: N/A   Occupational History  . Not on file.   Social History Main Topics  . Smoking status: Former Games developermoker  . Smokeless tobacco: Not on file  . Alcohol Use: No  . Drug Use: No  . Sexual Activity: Not on file   Other Topics Concern  . Not on file   Social History Narrative    Current Outpatient Prescriptions on File Prior to Visit  Medication Sig Dispense Refill  . amitriptyline (ELAVIL) 25 MG tablet Take 25 mg by mouth at bedtime.    Marland Kitchen. aspirin 325 MG tablet Take 325 mg by mouth daily.    . Insulin Pen Needle (UNIFINE PENTIPS) 31G X 6 MM MISC Use 1 time per day to inject insulin. 60 each 2  . multivitamin-iron-minerals-folic acid (CENTRUM) chewable tablet Chew 1 tablet by mouth daily.    . pravastatin (PRAVACHOL) 40 MG tablet Take 40 mg by mouth daily.     No current facility-administered medications on file prior to visit.    No Known Allergies  Family History  Problem Relation Age of Onset  . Diabetes Mother     BP 124/80  mmHg  Pulse 91  Ht 5\' 10"  (1.778 m)  Wt 171 lb (77.565 kg)  BMI 24.54 kg/m2  SpO2 97%  Review of Systems He denies hypoglycemia.      Objective:   Physical Exam VITAL SIGNS:  See vs page GENERAL: no distress SKIN:  Insulin injection sites at the anterior abdomen are normal.   Pulses: dorsalis pedis intact bilat.   MSK: no deformity of the feet CV: no leg edema Skin:  no ulcer on the feet.  normal color and temp on the feet. Neuro: sensation is intact to touch on the feet.       Assessment & Plan:  DM: he needs increased rx.   noncompliance with cbg recording and insulin.    Patient is advised the following: Patient Instructions  check your blood sugar twice a day.  vary the time of day when you check, between before the 3 meals, and at bedtime.  also check if you have symptoms of your blood sugar being too high or too low.  please keep a record of the readings and bring it to your next appointment here (or you can bring the meter itself).  You can write it on any piece of paper.  please call us sooner if your blood sugar goes  below 70, or if you have a lot of readings over 200.  Increase the levemir to 60 units each morning.   Please come back for a follow-up appointment in 1 month.

## 2016-04-22 ENCOUNTER — Encounter: Payer: Self-pay | Admitting: Endocrinology

## 2016-04-22 ENCOUNTER — Ambulatory Visit (INDEPENDENT_AMBULATORY_CARE_PROVIDER_SITE_OTHER): Payer: Medicare Other | Admitting: Endocrinology

## 2016-04-22 VITALS — BP 136/74 | HR 88 | Temp 98.2°F | Wt 176.0 lb

## 2016-04-22 DIAGNOSIS — IMO0002 Reserved for concepts with insufficient information to code with codable children: Secondary | ICD-10-CM

## 2016-04-22 DIAGNOSIS — E118 Type 2 diabetes mellitus with unspecified complications: Secondary | ICD-10-CM | POA: Diagnosis not present

## 2016-04-22 DIAGNOSIS — E1165 Type 2 diabetes mellitus with hyperglycemia: Secondary | ICD-10-CM

## 2016-04-22 LAB — POCT GLYCOSYLATED HEMOGLOBIN (HGB A1C): HEMOGLOBIN A1C: 12.2

## 2016-04-22 MED ORDER — INSULIN DETEMIR 100 UNIT/ML FLEXPEN: 70.0000 [IU] | PEN_INJECTOR | SUBCUTANEOUS | Status: DC

## 2016-04-22 NOTE — Patient Instructions (Addendum)
check your blood sugar twice a day.  vary the time of day when you check, between before the 3 meals, and at bedtime.  also check if you have symptoms of your blood sugar being too high or too low.  please keep a record of the readings and bring it to your next appointment here (or you can bring the meter itself).  You can write it on any piece of paper.  please call us sooner if your blood sugar goes below 70, or if you have a lot of readings over 200.  This is really important to do, fopr your safety.   Increase the levemir to 70 units each morning.   Please call us next week, to tell us how the blood sugar is doing.   Please come back for a follow-up appointment in 2-3 months.

## 2016-04-22 NOTE — Progress Notes (Signed)
Pre visit review using our clinic review tool, if applicable. No additional management support is needed unless otherwise documented below in the visit note. 

## 2016-04-22 NOTE — Progress Notes (Signed)
Subjective:    Patient ID: Charles Glass, male    DOB: Apr 30, 1946, 70 y.o.   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  Pancreatitis: never Other: he takes qd insulin, due to h/o noncompliance.  Interval history: he does not check cbg's.  Pt says he now never misses the insulin injections.  pt states he feels well in general.  Past Medical History  Diagnosis Date  . Hypertension   . Hyperlipidemia   . Vitamin D deficiency     Past Surgical History  Procedure Laterality Date  . C-spine    . Other surgical history  Left Arm    Social History   Social History  . Marital Status: Single    Spouse Name: N/A  . Number of Children: N/A  . Years of Education: N/A   Occupational History  . Not on file.   Social History Main Topics  . Smoking status: Former Games developer  . Smokeless tobacco: Not on file  . Alcohol Use: No  . Drug Use: No  . Sexual Activity: Not on file   Other Topics Concern  . Not on file   Social History Narrative    Current Outpatient Prescriptions on File Prior to Visit  Medication Sig Dispense Refill  . amitriptyline (ELAVIL) 25 MG tablet Take 25 mg by mouth at bedtime.    Marland Kitchen aspirin 325 MG tablet Take 325 mg by mouth daily.    . Insulin Pen Needle (UNIFINE PENTIPS) 31G X 6 MM MISC Use 1 time per day to inject insulin. 60 each 2  . multivitamin-iron-minerals-folic acid (CENTRUM) chewable tablet Chew 1 tablet by mouth daily.    . pravastatin (PRAVACHOL) 40 MG tablet Take 40 mg by mouth daily.     No current facility-administered medications on file prior to visit.    No Known Allergies  Family History  Problem Relation Age of Onset  . Diabetes Mother     BP 136/74 mmHg  Pulse 88  Temp(Src) 98.2 F (36.8 C) (Oral)  Wt  176 lb (79.833 kg)  SpO2 97%  Review of Systems He denies hypoglycemia.  He denies n/v.      Objective:   Physical Exam VITAL SIGNS:  See vs page GENERAL: no distress Pulses: dorsalis pedis intact bilat.   MSK: no deformity of the feet CV: no leg edema.  Skin:  no ulcer on the feet.  normal color and temp on the feet. Neuro: sensation is intact to touch on the feet    Lab Results  Component Value Date   HGBA1C 12.2 04/22/2016      Assessment & Plan:  DM: he needs increased rx.     Patient is advised the following: Patient Instructions  check your blood sugar twice a day.  vary the time of day when you check, between before the 3 meals, and at bedtime.  also check if you have symptoms of your blood sugar being too high or too low.  please keep a record of the readings and bring it to your next appointment here (or you can bring the meter itself).  You can write it on any piece of paper.  please call us sooner if your blood sugar goes below 70, or if you have a lot of readings over 200.  This is  really important to do, fopr your safety.   Increase the levemir to 70 units each morning.   Please call us next week, to tell us how the blood sugar is doing.   Please come back for a follow-up appointment in 2-3 months.

## 2016-06-08 LAB — HM DIABETES EYE EXAM

## 2016-06-22 ENCOUNTER — Ambulatory Visit (INDEPENDENT_AMBULATORY_CARE_PROVIDER_SITE_OTHER): Payer: Medicare Other | Admitting: Endocrinology

## 2016-06-22 ENCOUNTER — Encounter: Payer: Self-pay | Admitting: Endocrinology

## 2016-06-22 VITALS — BP 128/80 | HR 88 | Ht 70.0 in | Wt 180.0 lb

## 2016-06-22 DIAGNOSIS — Z794 Long term (current) use of insulin: Secondary | ICD-10-CM | POA: Diagnosis not present

## 2016-06-22 DIAGNOSIS — E118 Type 2 diabetes mellitus with unspecified complications: Secondary | ICD-10-CM

## 2016-06-22 DIAGNOSIS — E1165 Type 2 diabetes mellitus with hyperglycemia: Secondary | ICD-10-CM | POA: Diagnosis not present

## 2016-06-22 DIAGNOSIS — IMO0002 Reserved for concepts with insufficient information to code with codable children: Secondary | ICD-10-CM

## 2016-06-22 LAB — POCT GLYCOSYLATED HEMOGLOBIN (HGB A1C): Hemoglobin A1C: 9.2

## 2016-06-22 MED ORDER — INSULIN DETEMIR 100 UNIT/ML FLEXPEN: 80.0000 [IU] | PEN_INJECTOR | SUBCUTANEOUS | Status: DC

## 2016-06-22 NOTE — Progress Notes (Signed)
Subjective:    Patient ID: Robey Massmann, male    DOB: [birth year], 70 y.o.   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  Pancreatitis: never Other: he takes qd insulin, due to h/o noncompliance.  Interval history: no cbg record, but states cbg's vary from 88-100's.  It is lowest in the afternoon.  Pt says he now never misses the insulin injections.  pt states he feels well in general.  Past Medical History  Diagnosis Date  . Hypertension   . Hyperlipidemia   . Vitamin D deficiency     Past Surgical History  Procedure Laterality Date  . C-spine    . Other surgical history  Left Arm    Social History   Social History  . Marital Status: Single    Spouse Name: N/A  . Number of Children: N/A  . Years of Education: N/A   Occupational History  . Not on file.   Social History Main Topics  . Smoking status: Former Games developer  . Smokeless tobacco: Not on file  . Alcohol Use: No  . Drug Use: No  . Sexual Activity: Not on file   Other Topics Concern  . Not on file   Social History Narrative    Current Outpatient Prescriptions on File Prior to Visit  Medication Sig Dispense Refill  . amitriptyline (ELAVIL) 25 MG tablet Take 25 mg by mouth at bedtime.    Marland Kitchen aspirin 325 MG tablet Take 325 mg by mouth daily.    . Insulin Pen Needle (UNIFINE PENTIPS) 31G X 6 MM MISC Use 1 time per day to inject insulin. 60 each 2  . multivitamin-iron-minerals-folic acid (CENTRUM) chewable tablet Chew 1 tablet by mouth daily.    . pravastatin (PRAVACHOL) 40 MG tablet Take 40 mg by mouth daily.     No current facility-administered medications on file prior to visit.    No Known Allergies  Family History  Problem Relation Age of Onset  . Diabetes Mother     BP 128/80  mmHg  Pulse 88  Ht  (1.778 m)  Wt 180 lb (81.647 kg)  BMI 25.83 kg/m2  SpO2 98%  Review of Systems He denies hypoglycemia.     Objective:   Physical Exam VITAL SIGNS:  See vs page GENERAL: no distress Pulses: dorsalis pedis intact bilat.   MSK: no deformity of the feet CV: no leg edema.  Skin:  no ulcer on the feet.  normal color and temp on the feet. Neuro: sensation is intact to touch on the feet, but decreased from normal.     A1c=9.2%    Assessment & Plan:  Insulin-requiring type 2 DM: he needs increased rx.   Patient is advised the following: Patient Instructions  check your blood sugar twice a day.  vary the time of day when you check, between before the 3 meals, and at bedtime.  also check if you have symptoms of your blood sugar being too high or too low.  please keep a record of the readings and bring it to your next appointment here (or you can bring the meter itself).  You can write it on any piece of paper.  please call us sooner if your blood sugar goes below 70, or if you have a lot of readings over 200.  This is really important to do, fopr your safety.   Increase the levemir to 80 units each morning.   On this type of insulin schedule, you should eat meals on a regular schedule, especially lunch.  If a meal is missed or significantly delayed, your blood sugar could go low.  Please come back for a follow-up appointment in 2-3 months.    Romero BellingELLISON, Vedanth Sirico, MD

## 2016-06-22 NOTE — Patient Instructions (Addendum)
check your blood sugar twice a day.  vary the time of day when you check, between before the 3 meals, and at bedtime.  also check if you have symptoms of your blood sugar being too high or too low.  please keep a record of the readings and bring it to your next appointment here (or you can bring the meter itself).  You can write it on any piece of paper.  please call us sooner if your blood sugar goes below 70, or if you have a lot of readings over 200.  This is really important to do, fopr your safety.   Increase the levemir to 80 units each morning.   On this type of insulin schedule, you should eat meals on a regular schedule, especially lunch.  If a meal is missed or significantly delayed, your blood sugar could go low.  Please come back for a follow-up appointment in 2-3 months.

## 2016-06-24 ENCOUNTER — Telehealth: Payer: Self-pay | Admitting: Endocrinology

## 2016-06-24 MED ORDER — GLUCOSE BLOOD VI STRP: 1.0000 | ORAL_STRIP | Freq: Two times a day (BID) | Status: AC

## 2016-06-24 NOTE — Telephone Encounter (Signed)
Rx sent through e-scribe  

## 2016-06-24 NOTE — Telephone Encounter (Signed)
Patient need a refill of one touch ultra test strips,  [facility] - [place], [place] - [address] [phone] ([phone] (Fax)

## 2016-09-22 ENCOUNTER — Encounter: Payer: Self-pay | Admitting: Endocrinology

## 2016-09-22 ENCOUNTER — Ambulatory Visit (INDEPENDENT_AMBULATORY_CARE_PROVIDER_SITE_OTHER): Payer: Medicare Other | Admitting: Endocrinology

## 2016-09-22 VITALS — BP 126/84 | HR 90 | Wt 181.0 lb

## 2016-09-22 DIAGNOSIS — IMO0002 Reserved for concepts with insufficient information to code with codable children: Secondary | ICD-10-CM

## 2016-09-22 DIAGNOSIS — E118 Type 2 diabetes mellitus with unspecified complications: Secondary | ICD-10-CM

## 2016-09-22 DIAGNOSIS — Z794 Long term (current) use of insulin: Secondary | ICD-10-CM | POA: Diagnosis not present

## 2016-09-22 DIAGNOSIS — E1165 Type 2 diabetes mellitus with hyperglycemia: Secondary | ICD-10-CM

## 2016-09-22 LAB — POCT GLYCOSYLATED HEMOGLOBIN (HGB A1C): HEMOGLOBIN A1C: 10.4

## 2016-09-22 NOTE — Patient Instructions (Addendum)
check your blood sugar twice a day.  vary the time of day when you check, between before the 3 meals, and at bedtime.  also check if you have symptoms of your blood sugar being too high or too low.  please keep a record of the readings and bring it to your next appointment here (or you can bring the meter itself).  You can write it on any piece of paper.  please call us sooner if your blood sugar goes below 70, or if you have a lot of readings over 200.  This is really important to do, fopr your safety.   Please increase the levemir to 80 units each morning.   On this type of insulin schedule, you should eat meals on a regular schedule, especially lunch.  If a meal is missed or significantly delayed, your blood sugar could go low.   The trouble sleeping is not due to the insulin, so please see Dr Loney HeringBluth, to see what he can do for this.  Please come back for a follow-up appointment in 3 months.

## 2016-09-22 NOTE — Progress Notes (Signed)
Subjective:    Patient ID: Charles GambleMelvin Glass, male    DOB: 05/07/1946, 70 y.o.   MRN: [id number]  HPI Pt returns for f/u of diabetes mellitus: DM type: Insulin-requiring type 2 (but he has several factors suggesting he is developing type 1) Dx'ed: 2000 Complications: polyneuropathy Therapy: insulin since 2015 DKA: never Severe hypoglycemia: never.  Pancreatitis: never Other: he takes qd insulin, due to h/o noncompliance.  Interval history: he does not check cbg's.  Pt says he never misses the insulin injections.  pt states he feels well in general.  He takes levemir, just 70 units qam.  He tried increasing to 80, but he says this caused insomnia Past Medical History:  Diagnosis Date  . Hyperlipidemia   . Hypertension   . Vitamin D deficiency     Past Surgical History:  Procedure Laterality Date  . C-Spine    . OTHER SURGICAL HISTORY  Left Arm    Social History   Social History  . Marital status: Single    Spouse name: N/A  . Number of children: N/A  . Years of education: N/A   Occupational History  . Not on file.   Social History Main Topics  . Smoking status: Former Games developermoker  . Smokeless tobacco: Not on file  . Alcohol use No  . Drug use: No  . Sexual activity: Not on file   Other Topics Concern  . Not on file   Social History Narrative  . No narrative on file    Current Outpatient Prescriptions on File Prior to Visit  Medication Sig Dispense Refill  . amitriptyline (ELAVIL) 25 MG tablet Take 25 mg by mouth at bedtime.    Marland Kitchen. aspirin 325 MG tablet Take 325 mg by mouth daily.    Marland Kitchen. glucose blood (ONE TOUCH TEST STRIPS) test strip 1 each by Other route 2 (two) times daily. 200 each 1  . Insulin Detemir (LEVEMIR FLEXTOUCH) 100 UNIT/ML Pen Inject 80 Units into the skin every morning. 30 mL 11  . Insulin Pen Needle (UNIFINE PENTIPS) 31G X 6 MM MISC Use 1 time per day to inject insulin. 60 each 2  . multivitamin-iron-minerals-folic acid (CENTRUM) chewable tablet Chew 1  tablet by mouth daily.    . pravastatin (PRAVACHOL) 40 MG tablet Take 40 mg by mouth daily.     No current facility-administered medications on file prior to visit.     No Known Allergies  Family History  Problem Relation Age of Onset  . Diabetes Mother     BP 126/84 (BP Location: Right Arm, Patient Position: Sitting, Cuff Size: Normal)   Pulse 90   Wt 181 lb (82.1 kg)   SpO2 98%   BMI 25.97 kg/m    Review of Systems He denies hypoglycemia    Objective:   Physical Exam VITAL SIGNS:  See vs page GENERAL: no distress Pulses: dorsalis pedis intact bilat.   MSK: no deformity of the feet CV: no leg edema.  Skin:  no ulcer on the feet.  normal color and temp on the feet. Neuro: sensation is intact to touch on the feet, but decreased from normal.     A1c=10.4%    Assessment & Plan:  Insulin-requiring type 2 DM, with polyneuropathy: he needs increased rx.  Insomnia: new to me: perceived due to insulin.  This is limiting rx of diabetes.   Patient is advised the following: Patient Instructions  check your blood sugar twice a day.  vary the time of day when  you check, between before the 3 meals, and at bedtime.  also check if you have symptoms of your blood sugar being too high or too low.  please keep a record of the readings and bring it to your next appointment here (or you can bring the meter itself).  You can write it on any piece of paper.  please call us sooner if your blood sugar goes below 70, or if you have a lot of readings over 200.  This is really important to do, fopr your safety.   Please increase the levemir to 80 units each morning.   On this type of insulin schedule, you should eat meals on a regular schedule, especially lunch.  If a meal is missed or significantly delayed, your blood sugar could go low.   The trouble sleeping is not due to the insulin, so please see Dr Loney Hering, to see what he can do for this.  Please come back for a follow-up appointment in 3  months.

## 2016-12-23 ENCOUNTER — Ambulatory Visit: Payer: Medicare Other | Admitting: Endocrinology

## 2017-01-11 ENCOUNTER — Ambulatory Visit: Payer: Medicare Other | Admitting: Endocrinology

## 2017-01-12 ENCOUNTER — Telehealth: Payer: Self-pay | Admitting: Endocrinology

## 2017-01-12 NOTE — Telephone Encounter (Signed)
Patient no showed today's appt. Please advise on how to follow up. °A. No follow up necessary. °B. Follow up urgent. Contact patient immediately. °C. Follow up necessary. Contact patient and schedule visit in ___ days. °D. Follow up advised. Contact patient and schedule visit in ____weeks. ° °

## 2017-01-13 NOTE — Telephone Encounter (Signed)
Follow up advised. Contact patient and schedule visit in 4 weeks 

## 2017-01-16 NOTE — Telephone Encounter (Signed)
LVM for Pt to reschedule appointment. °

## 2017-01-23 ENCOUNTER — Telehealth: Payer: Self-pay | Admitting: Endocrinology

## 2017-01-23 MED ORDER — INSULIN DETEMIR 100 UNIT/ML FLEXPEN: 80.0000 [IU] | PEN_INJECTOR | SUBCUTANEOUS | 11 refills | Status: DC

## 2017-01-23 NOTE — Telephone Encounter (Signed)
Patient  Is out of insulin, he stated he got to vials last time, now they gave him only one.  Patient is needing his insulin called in today. didn't say which insulin it was,  [facility]

## 2017-01-23 NOTE — Telephone Encounter (Signed)
Refill for Levemir submitted to [facility] [place].

## 2017-01-25 ENCOUNTER — Ambulatory Visit (INDEPENDENT_AMBULATORY_CARE_PROVIDER_SITE_OTHER): Payer: Medicare Other | Admitting: Endocrinology

## 2017-01-25 ENCOUNTER — Encounter: Payer: Self-pay | Admitting: Endocrinology

## 2017-01-25 VITALS — BP 116/70 | HR 93 | Ht 70.5 in | Wt 177.0 lb

## 2017-01-25 DIAGNOSIS — E1165 Type 2 diabetes mellitus with hyperglycemia: Secondary | ICD-10-CM | POA: Diagnosis not present

## 2017-01-25 DIAGNOSIS — E118 Type 2 diabetes mellitus with unspecified complications: Secondary | ICD-10-CM

## 2017-01-25 DIAGNOSIS — IMO0002 Reserved for concepts with insufficient information to code with codable children: Secondary | ICD-10-CM

## 2017-01-25 DIAGNOSIS — Z794 Long term (current) use of insulin: Secondary | ICD-10-CM

## 2017-01-25 LAB — POCT GLYCOSYLATED HEMOGLOBIN (HGB A1C): HEMOGLOBIN A1C: 8.3

## 2017-01-25 MED ORDER — INSULIN DETEMIR 100 UNIT/ML FLEXPEN: 90.0000 [IU] | PEN_INJECTOR | SUBCUTANEOUS | 11 refills | Status: DC

## 2017-01-25 NOTE — Progress Notes (Signed)
Subjective:    Patient ID: Augusto GambleMelvin Connell, male    DOB: 08/11/1946, 71 y.o.   MRN: [id number]  HPI Pt returns for f/u of diabetes mellitus: DM type: Insulin-requiring type 2 (but he has several factors suggesting he is developing type 1) Dx'ed: 2000 Complications: polyneuropathy Therapy: insulin since 2015 DKA: never Severe hypoglycemia: never.  Pancreatitis: never Other: he takes qd insulin, due to h/o noncompliance.  Interval history: he does not check cbg's.  Pt says he never misses the insulin injections.  pt states he feels well in general.  He takes levemir, 80 units qam.   Past Medical History:  Diagnosis Date  . Hyperlipidemia   . Hypertension   . Vitamin D deficiency     Past Surgical History:  Procedure Laterality Date  . C-Spine    . OTHER SURGICAL HISTORY  Left Arm    Social History   Social History  . Marital status: Single    Spouse name: N/A  . Number of children: N/A  . Years of education: N/A   Occupational History  . Not on file.   Social History Main Topics  . Smoking status: Former Games developermoker  . Smokeless tobacco: Never Used  . Alcohol use No  . Drug use: No  . Sexual activity: Not on file   Other Topics Concern  . Not on file   Social History Narrative  . No narrative on file    Current Outpatient Prescriptions on File Prior to Visit  Medication Sig Dispense Refill  . amitriptyline (ELAVIL) 25 MG tablet Take 25 mg by mouth at bedtime.    Marland Kitchen. aspirin 325 MG tablet Take 325 mg by mouth daily.    Marland Kitchen. glucose blood (ONE TOUCH TEST STRIPS) test strip 1 each by Other route 2 (two) times daily. 200 each 1  . Insulin Pen Needle (UNIFINE PENTIPS) 31G X 6 MM MISC Use 1 time per day to inject insulin. 60 each 2  . multivitamin-iron-minerals-folic acid (CENTRUM) chewable tablet Chew 1 tablet by mouth daily.    . pravastatin (PRAVACHOL) 40 MG tablet Take 40 mg by mouth daily.     No current facility-administered medications on file prior to visit.     No  Known Allergies  Family History  Problem Relation Age of Onset  . Diabetes Mother     BP 116/70   Pulse 93   Ht 5' 10.5" (1.791 m)   Wt 177 lb (80.3 kg)   SpO2 94%   BMI 25.04 kg/m    Review of Systems He denies hypoglycemia.      Objective:   Physical Exam VITAL SIGNS:  See vs page GENERAL: no distress Pulses: dorsalis pedis intact bilat.   MSK: no deformity of the feet CV: no leg edema.   Skin:  no ulcer on the feet.  normal color and temp on the feet.   Neuro: sensation is intact to touch on the feet, but decreased from normal.     A1c=8.3%    Assessment & Plan:  Insulin-requiring type 2 DM, with polyneuropathy: he needs increased rx  Patient is advised the following: Patient Instructions  check your blood sugar twice a day.  vary the time of day when you check, between before the 3 meals, and at bedtime.  also check if you have symptoms of your blood sugar being too high or too low.  please keep a record of the readings and bring it to your next appointment here (or you can bring  the meter itself).  You can write it on any piece of paper.  please call us sooner if your blood sugar goes below 70, or if you have a lot of readings over 200.  This is really important to do, fopr your safety.  Please increase the levemir to 90 units each morning.  On this type of insulin schedule, you should eat meals on a regular schedule, especially lunch.  If a meal is missed or significantly delayed, your blood sugar could go low.  Please come back for a follow-up appointment in 3 months.

## 2017-01-25 NOTE — Patient Instructions (Addendum)
check your blood sugar twice a day.  vary the time of day when you check, between before the 3 meals, and at bedtime.  also check if you have symptoms of your blood sugar being too high or too low.  please keep a record of the readings and bring it to your next appointment here (or you can bring the meter itself).  You can write it on any piece of paper.  please call us sooner if your blood sugar goes below 70, or if you have a lot of readings over 200.  This is really important to do, fopr your safety.  Please increase the levemir to 90 units each morning.  On this type of insulin schedule, you should eat meals on a regular schedule, especially lunch.  If a meal is missed or significantly delayed, your blood sugar could go low.  Please come back for a follow-up appointment in 3 months.

## 2017-01-31 NOTE — Telephone Encounter (Signed)
Pt thought he was to get 2 pens of the levemir please advise  He is asking us to call the pharmacy [facility]

## 2017-01-31 NOTE — Telephone Encounter (Signed)
I contacted the patient and advised via voicemail the levemir is dispensed 1 box with 5 pens. Patient was advised we could not send in just 2 pens of the levemir and with the dosage we have him on currently he should be receiving 10 pens or 2 boxes. Patient advised to call back if had any further questions.

## 2017-02-02 ENCOUNTER — Other Ambulatory Visit: Payer: Self-pay | Admitting: Endocrinology

## 2017-02-02 MED ORDER — INSULIN NPH (HUMAN) (ISOPHANE) 100 UNIT/ML ~~LOC~~ SUSP: 60.0000 [IU] | SUBCUTANEOUS | 11 refills | Status: DC

## 2017-02-02 NOTE — Telephone Encounter (Signed)
levemir is too expensive can we cheaper alternate   [facility] [place]  Please call the pt back once done

## 2017-02-02 NOTE — Telephone Encounter (Signed)
Please change the levemir to NPH, 60 units each morning. [facility] to [facility].  I'll see you next time.

## 2017-02-02 NOTE — Telephone Encounter (Signed)
See message. Look over the formulary for 2018, but the copayment is too expensive. Please advise, Thanks!

## 2017-02-03 NOTE — Telephone Encounter (Signed)
Attempted to reach the patient. Patient was not available and did not have a working voicemail. Will try again at a later time.  

## 2017-02-06 NOTE — Telephone Encounter (Signed)
Attempted to reach the patient. Patient was not available and did not have a working voicemail.  

## 2017-02-07 ENCOUNTER — Ambulatory Visit (INDEPENDENT_AMBULATORY_CARE_PROVIDER_SITE_OTHER): Payer: Medicare Other | Admitting: Endocrinology

## 2017-02-07 ENCOUNTER — Encounter: Payer: Self-pay | Admitting: Endocrinology

## 2017-02-07 VITALS — BP 116/84 | HR 88 | Ht 70.5 in | Wt 182.0 lb

## 2017-02-07 DIAGNOSIS — IMO0002 Reserved for concepts with insufficient information to code with codable children: Secondary | ICD-10-CM

## 2017-02-07 DIAGNOSIS — E1165 Type 2 diabetes mellitus with hyperglycemia: Secondary | ICD-10-CM

## 2017-02-07 DIAGNOSIS — E118 Type 2 diabetes mellitus with unspecified complications: Secondary | ICD-10-CM

## 2017-02-07 DIAGNOSIS — Z794 Long term (current) use of insulin: Secondary | ICD-10-CM | POA: Diagnosis not present

## 2017-02-07 MED ORDER — INSULIN DETEMIR 100 UNIT/ML FLEXPEN: 110.0000 [IU] | PEN_INJECTOR | SUBCUTANEOUS | 11 refills | Status: DC

## 2017-02-07 NOTE — Progress Notes (Signed)
Subjective:    Patient ID: Charles Glass, male    DOB: 04/07/1946, 71 y.o.   MRN: [id number]  HPI Pt returns for f/u of diabetes mellitus: DM type: Insulin-requiring type 2 (but he has several factors suggesting he is developing type 1).  Dx'ed: 2000 Complications: polyneuropathy.  Therapy: insulin since 2015.  DKA: never.  Severe hypoglycemia: never.  Pancreatitis: never.  Other: he takes qd insulin, due to h/o noncompliance.   Interval history: he does not check cbg's.  Pt says he never misses the insulin injections.  pt states he feels well in general.  He takes levemir, 90 units qam, but he says he can no longer afford.   Past Medical History:  Diagnosis Date  . Hyperlipidemia   . Hypertension   . Vitamin D deficiency     Past Surgical History:  Procedure Laterality Date  . C-Spine    . OTHER SURGICAL HISTORY  Left Arm    Social History   Social History  . Marital status: Single    Spouse name: N/A  . Number of children: N/A  . Years of education: N/A   Occupational History  . Not on file.   Social History Main Topics  . Smoking status: Former Games developermoker  . Smokeless tobacco: Never Used  . Alcohol use No  . Drug use: No  . Sexual activity: Not on file   Other Topics Concern  . Not on file   Social History Narrative  . No narrative on file    Current Outpatient Prescriptions on File Prior to Visit  Medication Sig Dispense Refill  . amitriptyline (ELAVIL) 25 MG tablet Take 25 mg by mouth at bedtime.    Marland Kitchen. aspirin 325 MG tablet Take 325 mg by mouth daily.    Marland Kitchen. glucose blood (ONE TOUCH TEST STRIPS) test strip 1 each by Other route 2 (two) times daily. 200 each 1  . Insulin Pen Needle (UNIFINE PENTIPS) 31G X 6 MM MISC Use 1 time per day to inject insulin. 60 each 2  . multivitamin-iron-minerals-folic acid (CENTRUM) chewable tablet Chew 1 tablet by mouth daily.    . pravastatin (PRAVACHOL) 40 MG tablet Take 40 mg by mouth daily.     No current  facility-administered medications on file prior to visit.     No Known Allergies  Family History  Problem Relation Age of Onset  . Diabetes Mother    BP 116/84   Pulse 88   Ht 5' 10.5" (1.791 m)   Wt 182 lb (82.6 kg)   SpO2 98%   BMI 25.75 kg/m   Review of Systems He denies hypoglycemia sxs.      Objective:   Physical Exam VITAL SIGNS:  See vs page.  GENERAL: no distress.  Pulses: dorsalis pedis intact bilat.   MSK: no deformity of the feet CV: no leg edema.   Skin:  no ulcer on the feet.  normal color and temp on the feet.   Neuro: sensation is intact to touch on the feet, but decreased from normal.    Lab Results  Component Value Date   HGBA1C 8.3 01/25/2017       Assessment & Plan:  Insulin-requiring type 2 DM, with polyneuropathy: he needs increased rx  Patient is advised the following: Patient Instructions  check your blood sugar twice a day.  vary the time of day when you check, between before the 3 meals, and at bedtime.  also check if you have symptoms of your blood  sugar being too high or too low.  please keep a record of the readings and bring it to your next appointment here (or you can bring the meter itself).  You can write it on any piece of paper.  please call us sooner if your blood sugar goes below 70, or if you have a lot of readings over 200.  This is really important to do, for your safety.  Please increase the levemir to 110 units each morning.  On this type of insulin schedule, you should eat meals on a regular schedule, especially lunch.  If a meal is missed or significantly delayed, your blood sugar could go low.  Call the customer service number on your insurance card, to ask what insulin is cheaper than levemir.  Call us, to see if we can change Please come back for a follow-up appointment in 3 months.

## 2017-02-07 NOTE — Patient Instructions (Addendum)
check your blood sugar twice a day.  vary the time of day when you check, between before the 3 meals, and at bedtime.  also check if you have symptoms of your blood sugar being too high or too low.  please keep a record of the readings and bring it to your next appointment here (or you can bring the meter itself).  You can write it on any piece of paper.  please call us sooner if your blood sugar goes below 70, or if you have a lot of readings over 200.  This is really important to do, for your safety.  Please increase the levemir to 110 units each morning.  On this type of insulin schedule, you should eat meals on a regular schedule, especially lunch.  If a meal is missed or significantly delayed, your blood sugar could go low.  Call the customer service number on your insurance card, to ask what insulin is cheaper than levemir.  Call us, to see if we can change Please come back for a follow-up appointment in 3 months.

## 2017-04-25 ENCOUNTER — Ambulatory Visit: Payer: Medicare Other | Admitting: Endocrinology

## 2017-04-25 DIAGNOSIS — Z0289 Encounter for other administrative examinations: Secondary | ICD-10-CM

## 2017-04-26 ENCOUNTER — Telehealth: Payer: Self-pay | Admitting: Endocrinology

## 2017-04-27 NOTE — Telephone Encounter (Signed)
Patient no showed today's appt. Please advise on how to follow up. °A. No follow up necessary. °B. Follow up urgent. Contact patient immediately. °C. Follow up necessary. Contact patient and schedule visit in ___ days. °D. Follow up advised. Contact patient and schedule visit in ____weeks. ° °

## 2017-04-27 NOTE — Telephone Encounter (Signed)
Please come back for a follow-up appointment in 3 months 

## 2017-04-28 NOTE — Telephone Encounter (Signed)
Patient called again inquiring about appointment. Asked to be called by office staff.

## 2017-05-04 ENCOUNTER — Ambulatory Visit: Payer: Medicare Other | Admitting: Endocrinology

## 2017-05-11 ENCOUNTER — Other Ambulatory Visit: Payer: Self-pay | Admitting: "Endocrinology

## 2017-05-12 ENCOUNTER — Telehealth: Payer: Self-pay | Admitting: Endocrinology

## 2017-05-12 NOTE — Telephone Encounter (Signed)
Okay to leave a detailed message on patient's phone about the note below.

## 2017-05-12 NOTE — Telephone Encounter (Signed)
Patient can not afford the Insulin Detemir (LEVEMIR FLEXPEN) 100 UNIT/ML Pen.   Patient needs an alternative. Please call and advise patient.

## 2017-05-12 NOTE — Telephone Encounter (Signed)
What else can we call in for patient. Thank you!

## 2017-05-12 NOTE — Telephone Encounter (Signed)
Patient would like the medication sent to  [facility] [address] - [place], [place] - [address] [phone] (Phone) [phone] (Fax)   Once it gets clarified from [place]. Patient would like this done before the weekend.

## 2017-05-12 NOTE — Telephone Encounter (Signed)
We can change to NPH, but to get a good price, you would need to draw from the bottle.  OK with you?

## 2017-05-15 MED ORDER — INSULIN NPH (HUMAN) (ISOPHANE) 100 UNIT/ML ~~LOC~~ SUSP: SUBCUTANEOUS | 3 refills | Status: DC

## 2017-05-15 NOTE — Telephone Encounter (Signed)
  Rx [facility] N [facility] [place].

## 2017-05-15 NOTE — Telephone Encounter (Signed)
See message between the patient and Dr. Everardo AllEllison. Dr. Everardo AllEllison agreed to changing to the Levemir to NPH but did not specify the amount that should be prescribed. Could you please advise? Thanks!

## 2017-05-15 NOTE — Telephone Encounter (Signed)
Based on last OV note, we can start with 60 units before b'fast and 30 units at bedtime.

## 2017-05-31 ENCOUNTER — Encounter: Payer: Self-pay | Admitting: Endocrinology

## 2017-05-31 ENCOUNTER — Ambulatory Visit (INDEPENDENT_AMBULATORY_CARE_PROVIDER_SITE_OTHER): Payer: Medicare Other | Admitting: Endocrinology

## 2017-05-31 VITALS — BP 130/82 | HR 91 | Ht 70.5 in | Wt 182.0 lb

## 2017-05-31 DIAGNOSIS — Z794 Long term (current) use of insulin: Secondary | ICD-10-CM | POA: Diagnosis not present

## 2017-05-31 DIAGNOSIS — E118 Type 2 diabetes mellitus with unspecified complications: Secondary | ICD-10-CM

## 2017-05-31 DIAGNOSIS — E1165 Type 2 diabetes mellitus with hyperglycemia: Secondary | ICD-10-CM | POA: Diagnosis not present

## 2017-05-31 DIAGNOSIS — IMO0002 Reserved for concepts with insufficient information to code with codable children: Secondary | ICD-10-CM

## 2017-05-31 LAB — POCT GLYCOSYLATED HEMOGLOBIN (HGB A1C): HEMOGLOBIN A1C: 6.8

## 2017-05-31 MED ORDER — INSULIN NPH (HUMAN) (ISOPHANE) 100 UNIT/ML ~~LOC~~ SUSP: SUBCUTANEOUS | 3 refills | Status: DC

## 2017-05-31 NOTE — Progress Notes (Signed)
Subjective:    Patient ID: Charles Glass, male    DOB: 10/24/1946, 71 y.o.   MRN: [id number]  HPI Pt returns for f/u of diabetes mellitus: DM type: Insulin-requiring type 2 (but he has several factors suggesting he is developing type 1).   Dx'ed: 2000 Complications: polyneuropathy.  Therapy: insulin since 2015.  DKA: never.  Severe hypoglycemia: never.  Pancreatitis: never.  Other: he takes qd insulin, due to h/o noncompliance; he takes human insulin, due to cost.  Interval history: he does not check cbg's.  Pt says he never misses the insulin injections.  pt states he feels well in general.  He takes levemir, 90 units qam, but he says he can no longer afford.   Past Medical History:  Diagnosis Date  . Hyperlipidemia   . Hypertension   . Vitamin D deficiency     Past Surgical History:  Procedure Laterality Date  . C-Spine    . OTHER SURGICAL HISTORY  Left Arm    Social History   Social History  . Marital status: Single    Spouse name: N/A  . Number of children: N/A  . Years of education: N/A   Occupational History  . Not on file.   Social History Main Topics  . Smoking status: Former Games developermoker  . Smokeless tobacco: Never Used  . Alcohol use No  . Drug use: No  . Sexual activity: Not on file   Other Topics Concern  . Not on file   Social History Narrative  . No narrative on file    Current Outpatient Prescriptions on File Prior to Visit  Medication Sig Dispense Refill  . amitriptyline (ELAVIL) 25 MG tablet Take 50 mg by mouth at bedtime.     Marland Kitchen. aspirin 325 MG tablet Take 325 mg by mouth daily.    Marland Kitchen. glucose blood (ONE TOUCH TEST STRIPS) test strip 1 each by Other route 2 (two) times daily. 200 each 1  . Insulin Pen Needle (UNIFINE PENTIPS) 31G X 6 MM MISC Use 1 time per day to inject insulin. 60 each 2  . multivitamin-iron-minerals-folic acid (CENTRUM) chewable tablet Chew 1 tablet by mouth daily.    . pravastatin (PRAVACHOL) 40 MG tablet Take 40 mg by mouth  daily.     No current facility-administered medications on file prior to visit.     No Known Allergies  Family History  Problem Relation Age of Onset  . Diabetes Mother     BP 130/82   Pulse 91   Ht 5' 10.5" (1.791 m)   Wt 182 lb (82.6 kg)   SpO2 94%   BMI 25.75 kg/m    Review of Systems He denies hypoglycemia.     Objective:   Physical Exam VITAL SIGNS:  See vs page.  GENERAL: no distress.  Pulses: dorsalis pedis intact bilat.   MSK: no deformity of the feet CV: no leg edema.   Skin:  no ulcer on the feet.  normal color and temp on the feet.   Neuro: sensation is intact to touch on the feet, but decreased from normal.    A1c=6.8%    Assessment & Plan:  Insulin-requiring type 2 DM, with: overcontrolled, given this regimen, which does match insulin to his changing needs throughout the day Noncompliance with insulin.   This complicates the rx of DM.    Patient Instructions  check your blood sugar twice a day.  vary the time of day when you check, between before the 3 meals,  and at bedtime.  also check if you have symptoms of your blood sugar being too high or too low.  please keep a record of the readings and bring it to your next appointment here (or you can bring the meter itself).  You can write it on any piece of paper.  please call us sooner if your blood sugar goes below 70, or if you have a lot of readings over 200.  This is really important to do, for your safety.  Please reduce the insulin to 60 units in the morning, and 20 units in the evening. On this type of insulin schedule, you should eat meals on a regular schedule, especially lunch.  If a meal is missed or significantly delayed, your blood sugar could go low.  Please come back for a follow-up appointment in 3 months.

## 2017-05-31 NOTE — Patient Instructions (Addendum)
check your blood sugar twice a day.  vary the time of day when you check, between before the 3 meals, and at bedtime.  also check if you have symptoms of your blood sugar being too high or too low.  please keep a record of the readings and bring it to your next appointment here (or you can bring the meter itself).  You can write it on any piece of paper.  please call us sooner if your blood sugar goes below 70, or if you have a lot of readings over 200.  This is really important to do, for your safety.  Please reduce the insulin to 60 units in the morning, and 20 units in the evening. On this type of insulin schedule, you should eat meals on a regular schedule, especially lunch.  If a meal is missed or significantly delayed, your blood sugar could go low.  Please come back for a follow-up appointment in 3 months.

## 2017-07-18 ENCOUNTER — Encounter: Payer: Self-pay | Admitting: Endocrinology

## 2017-07-18 ENCOUNTER — Ambulatory Visit (INDEPENDENT_AMBULATORY_CARE_PROVIDER_SITE_OTHER): Payer: Medicare Other | Admitting: Endocrinology

## 2017-07-18 VITALS — BP 132/82 | HR 99 | Ht 70.5 in | Wt 175.0 lb

## 2017-07-18 DIAGNOSIS — E1165 Type 2 diabetes mellitus with hyperglycemia: Secondary | ICD-10-CM

## 2017-07-18 DIAGNOSIS — IMO0002 Reserved for concepts with insufficient information to code with codable children: Secondary | ICD-10-CM

## 2017-07-18 DIAGNOSIS — Z794 Long term (current) use of insulin: Secondary | ICD-10-CM

## 2017-07-18 DIAGNOSIS — E118 Type 2 diabetes mellitus with unspecified complications: Secondary | ICD-10-CM | POA: Diagnosis not present

## 2017-07-18 MED ORDER — INSULIN NPH (HUMAN) (ISOPHANE) 100 UNIT/ML ~~LOC~~ SUSP: SUBCUTANEOUS | 11 refills | Status: AC

## 2017-07-18 NOTE — Patient Instructions (Addendum)
check your blood sugar twice a day.  vary the time of day when you check, between before the 3 meals, and at bedtime.  also check if you have symptoms of your blood sugar being too high or too low.  please keep a record of the readings and bring it to your next appointment here (or you can bring the meter itself).  You can write it on any piece of paper.  please call us sooner if your blood sugar goes below 70, or if you have a lot of readings over 200.  This is really important to do, for your safety.  Please change the insulin to "Humulin' (the same as Novolin, but different manufacturer), also at 60 units in the morning, and 20 units in the evening. We are doing the patient assistance form today, to try to get it for free.  On this type of insulin schedule, you should eat meals on a regular schedule, especially lunch.  If a meal is missed or significantly delayed, your blood sugar could go low.  Please come back for a follow-up appointment in 2 months.

## 2017-07-18 NOTE — Progress Notes (Signed)
Subjective:    Patient ID: Charles Glass, male    DOB: January 03, 1946, 71 y.o.   MRN: [id number]  HPI Pt returns for f/u of diabetes mellitus: DM type: Insulin-requiring type 2 (but he has several factors suggesting he is developing type 1).   Dx'ed: 2000 Complications: polyneuropathy.  Therapy: insulin since 2015.  DKA: never.  Severe hypoglycemia: never.  Pancreatitis: never.  Other: he takes qd insulin, due to h/o noncompliance; he takes human insulin, due to cost.  Interval history: he does not check cbg's.  Pt says he never misses the insulin injections.  pt states he feels well in general.  He can no longer afford insulin, even novolin NPH at [employer].  Past Medical History:  Diagnosis Date  . Hyperlipidemia   . Hypertension   . Vitamin D deficiency     Past Surgical History:  Procedure Laterality Date  . C-Spine    . OTHER SURGICAL HISTORY  Left Arm    Social History   Social History  . Marital status: Single    Spouse name: N/A  . Number of children: N/A  . Years of education: N/A   Occupational History  . Not on file.   Social History Main Topics  . Smoking status: Former Games developer  . Smokeless tobacco: Never Used  . Alcohol use No  . Drug use: No  . Sexual activity: Not on file   Other Topics Concern  . Not on file   Social History Narrative  . No narrative on file    Current Outpatient Prescriptions on File Prior to Visit  Medication Sig Dispense Refill  . amitriptyline (ELAVIL) 25 MG tablet Take 50 mg by mouth at bedtime.     Marland Kitchen aspirin 325 MG tablet Take 325 mg by mouth daily.    Marland Kitchen glucose blood (ONE TOUCH TEST STRIPS) test strip 1 each by Other route 2 (two) times daily. 200 each 1  . Insulin Pen Needle (UNIFINE PENTIPS) 31G X 6 MM MISC Use 1 time per day to inject insulin. 60 each 2  . multivitamin-iron-minerals-folic acid (CENTRUM) chewable tablet Chew 1 tablet by mouth daily.    . pravastatin (PRAVACHOL) 40 MG tablet Take 40 mg by mouth daily.      No current facility-administered medications on file prior to visit.     No Known Allergies  Family History  Problem Relation Age of Onset  . Diabetes Mother     BP 132/82   Pulse 99   Ht 5' 10.5" (1.791 m)   Wt 175 lb (79.4 kg)   SpO2 99%   BMI 24.75 kg/m    Review of Systems He denies hypoglycemia    Objective:   Physical Exam VITAL SIGNS:  See vs page GENERAL: no distress Pulses: foot pulses are intact bilaterally.   MSK: no deformity of the feet or ankles.  CV: no edema of the legs or ankles Skin:  no ulcer on the feet or ankles.  normal color and temp on the feet and ankles Neuro: sensation is intact to touch on the feet and ankles.   Ext: There is bilateral onychomycosis of the toenails.       Assessment & Plan:  Insulin-requiring type 2 DM, with polyneuropathy. therapy limited by pt's request for least expensive meds.     Patient Instructions  check your blood sugar twice a day.  vary the time of day when you check, between before the 3 meals, and at bedtime.  also check if  you have symptoms of your blood sugar being too high or too low.  please keep a record of the readings and bring it to your next appointment here (or you can bring the meter itself).  You can write it on any piece of paper.  please call us sooner if your blood sugar goes below 70, or if you have a lot of readings over 200.  This is really important to do, for your safety.  Please change the insulin to "Humulin' (the same as Novolin, but different manufacturer), also at 60 units in the morning, and 20 units in the evening. We are doing the patient assistance form today, to try to get it for free.  On this type of insulin schedule, you should eat meals on a regular schedule, especially lunch.  If a meal is missed or significantly delayed, your blood sugar could go low.  Please come back for a follow-up appointment in 2 months.

## 2017-07-19 ENCOUNTER — Other Ambulatory Visit: Payer: Self-pay

## 2017-07-19 MED ORDER — INSULIN NPH (HUMAN) (ISOPHANE) 100 UNIT/ML ~~LOC~~ SUSP: SUBCUTANEOUS | 11 refills | Status: DC
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
## 2017-08-16 ENCOUNTER — Telehealth: Payer: Self-pay | Admitting: "Endocrinology

## 2017-08-16 NOTE — Telephone Encounter (Signed)
Mr. Charles Glass came  In the office today demanding to transfer his [facility] Mr. Charles Glass was very non compliant in the past and did NOT want to follow Dr. [facility] instructions for patient care and he decided some time ago to [facility]. We tried to explain to him that he needed to continue his care with Dr. Everardo AllEllison that would be the best care for him at this time and he stormed out of the office threatening to tell his insurance company we refused his care.

## 2017-08-24 ENCOUNTER — Other Ambulatory Visit: Payer: Self-pay

## 2017-08-24 ENCOUNTER — Telehealth: Payer: Self-pay | Admitting: Endocrinology

## 2017-08-24 NOTE — Telephone Encounter (Signed)
[employer] called in reference to needing PA for Novolin for patient. Please call [employer] and advise.

## 2017-08-24 NOTE — Telephone Encounter (Signed)
The number provided her isn't correct. I looked at patient's chart he is taking Humulin.

## 2017-09-01 ENCOUNTER — Ambulatory Visit (INDEPENDENT_AMBULATORY_CARE_PROVIDER_SITE_OTHER): Payer: Medicare Other | Admitting: Endocrinology

## 2017-09-01 ENCOUNTER — Encounter: Payer: Self-pay | Admitting: Endocrinology

## 2017-09-01 VITALS — BP 128/80 | HR 97 | Wt 166.2 lb

## 2017-09-01 DIAGNOSIS — Z794 Long term (current) use of insulin: Secondary | ICD-10-CM

## 2017-09-01 DIAGNOSIS — E1165 Type 2 diabetes mellitus with hyperglycemia: Secondary | ICD-10-CM

## 2017-09-01 DIAGNOSIS — IMO0002 Reserved for concepts with insufficient information to code with codable children: Secondary | ICD-10-CM

## 2017-09-01 DIAGNOSIS — E118 Type 2 diabetes mellitus with unspecified complications: Secondary | ICD-10-CM | POA: Diagnosis not present

## 2017-09-01 LAB — POCT GLYCOSYLATED HEMOGLOBIN (HGB A1C): HEMOGLOBIN A1C: 9.8

## 2017-09-01 NOTE — Progress Notes (Signed)
Subjective:    Patient ID: Charles Glass, male    DOB: May 18, 1946, 71 y.o.   MRN: [id number]  HPI Pt returns for f/u of diabetes mellitus: DM type: Insulin-requiring type 2 (but he has several factors suggesting he is developing type 1).   Dx'ed: 2000 Complications: polyneuropathy.  Therapy: insulin since 2015.  DKA: never.  Severe hypoglycemia: never.  Pancreatitis: never.  Other: he is not a candidate for multiple daily injections, due to h/o noncompliance; he takes human insulin, due to cost.  Interval history: He takes 60 units qam and 30 units qpm.  She says he sometimes misses the pm dose.  Pt says he checks cbg's, but does no know what cbg's are.  He says cost factors are preventing him from getting insulin.  Past Medical History:  Diagnosis Date  . Hyperlipidemia   . Hypertension   . Vitamin D deficiency     Past Surgical History:  Procedure Laterality Date  . C-Spine    . OTHER SURGICAL HISTORY  Left Arm    Social History   Social History  . Marital status: Single    Spouse name: N/A  . Number of children: N/A  . Years of education: N/A   Occupational History  . Not on file.   Social History Main Topics  . Smoking status: Former Games developer  . Smokeless tobacco: Never Used  . Alcohol use No  . Drug use: No  . Sexual activity: Not on file   Other Topics Concern  . Not on file   Social History Narrative  . No narrative on file    Current Outpatient Prescriptions on File Prior to Visit  Medication Sig Dispense Refill  . amitriptyline (ELAVIL) 25 MG tablet Take 50 mg by mouth at bedtime.     Marland Kitchen aspirin 325 MG tablet Take 325 mg by mouth daily.    Marland Kitchen glucose blood (ONE TOUCH TEST STRIPS) test strip 1 each by Other route 2 (two) times daily. 200 each 1  . insulin NPH Human (HUMULIN N) 100 UNIT/ML injection 60 units each morning, and 20 units each evening, and syringes 2/day 30 mL 11  . Insulin Pen Needle (UNIFINE PENTIPS) 31G X 6 MM MISC Use 1 time per day to  inject insulin. 60 each 2  . multivitamin-iron-minerals-folic acid (CENTRUM) chewable tablet Chew 1 tablet by mouth daily.    . pravastatin (PRAVACHOL) 40 MG tablet Take 40 mg by mouth daily.     No current facility-administered medications on file prior to visit.     No Known Allergies  Family History  Problem Relation Age of Onset  . Diabetes Mother     BP 128/80   Pulse 97   Wt 166 lb 3.2 oz (75.4 kg)   SpO2 98%   BMI 23.51 kg/m    Review of Systems He denies hypoglycemia.     Objective:   Physical Exam VITAL SIGNS:  See vs page GENERAL: no distress Pulses: foot pulses are intact bilaterally.   MSK: no deformity of the feet or ankles.  CV: trace bilat edema of the legs or ankles.  Skin:  no ulcer on the feet or ankles.  normal color and temp on the feet and ankles.  Neuro: sensation is intact to touch on the feet and ankles, but decreased from normal.  Ext: There is bilateral onychomycosis of the toenails.    Lab Results  Component Value Date   HGBA1C 9.8 09/01/2017  Assessment & Plan:  Insulin-requiring type 2 DM, with polyneuropathy: worse.  Noncompliance with cbg recording: this complicates the rx of DM.   Patient Instructions  check your blood sugar twice a day.  vary the time of day when you check, between before the 3 meals, and at bedtime.  also check if you have symptoms of your blood sugar being too high or too low.  please keep a record of the readings and bring it to your next appointment here (or you can bring the meter itself).  You can write it on any piece of paper.  please call us sooner if your blood sugar goes below 70, or if you have a lot of readings over 200.  This is really important to do, for your safety.  Here is the patient assistance form today, to try to get it for free.  I have filled our my part.  Please complete the rest, and send in.  On this type of insulin schedule, you should eat meals on a regular schedule, especially  lunch.  If a meal is missed or significantly delayed, your blood sugar could go low.  Please come back for a follow-up appointment in 2 months.

## 2017-09-01 NOTE — Patient Instructions (Addendum)
check your blood sugar twice a day.  vary the time of day when you check, between before the 3 meals, and at bedtime.  also check if you have symptoms of your blood sugar being too high or too low.  please keep a record of the readings and bring it to your next appointment here (or you can bring the meter itself).  You can write it on any piece of paper.  please call us sooner if your blood sugar goes below 70, or if you have a lot of readings over 200.  This is really important to do, for your safety.  Here is the patient assistance form today, to try to get it for free.  I have filled our my part.  Please complete the rest, and send in.  On this type of insulin schedule, you should eat meals on a regular schedule, especially lunch.  If a meal is missed or significantly delayed, your blood sugar could go low.  Please come back for a follow-up appointment in 2 months.

## 2017-09-27 ENCOUNTER — Other Ambulatory Visit: Payer: Self-pay

## 2017-09-27 MED ORDER — PRAVASTATIN SODIUM 40 MG PO TABS: 40.0000 mg | ORAL_TABLET | Freq: Every day | ORAL | 2 refills | Status: DC

## 2017-10-05 ENCOUNTER — Other Ambulatory Visit: Payer: Self-pay

## 2017-10-05 MED ORDER — PRAVASTATIN SODIUM 40 MG PO TABS: 40.0000 mg | ORAL_TABLET | Freq: Every day | ORAL | 2 refills | Status: AC

## 2017-11-02 ENCOUNTER — Ambulatory Visit: Payer: Medicare Other | Admitting: Endocrinology

## 2017-11-15 ENCOUNTER — Ambulatory Visit: Payer: Medicare Other | Admitting: Endocrinology

## 2017-12-13 ENCOUNTER — Ambulatory Visit: Payer: Medicare Other | Admitting: Endocrinology

## 2017-12-13 DIAGNOSIS — Z0289 Encounter for other administrative examinations: Secondary | ICD-10-CM

## 2018-01-26 ENCOUNTER — Other Ambulatory Visit: Payer: Self-pay

## 2018-01-26 MED ORDER — INSULIN ASPART 100 UNIT/ML ~~LOC~~ SOLN: SUBCUTANEOUS | 5 refills | Status: AC

## 2018-01-26 NOTE — Telephone Encounter (Signed)
Per Dr. Everardo AllEllison, Humulin N changed to Novolin n same dosage because of insurance changes for the year 2019.

## 2018-12-29 ENCOUNTER — Other Ambulatory Visit: Payer: Self-pay | Admitting: Endocrinology

## 2018-12-30 NOTE — Telephone Encounter (Signed)
Please forward refill request to pt's primary care provider.   

## 2020-04-04 DEATH — deceased
